# Patient Record
Sex: Female | Born: 1940 | Race: Black or African American | Hispanic: No | Marital: Single | Smoking: Current every day smoker
Health system: Southern US, Community
[De-identification: ages and names within clinical notes are randomized; demographics above are authoritative.]

## PROBLEM LIST (undated history)

## (undated) DIAGNOSIS — I340 Nonrheumatic mitral (valve) insufficiency: Secondary | ICD-10-CM

## (undated) DIAGNOSIS — I639 Cerebral infarction, unspecified: Secondary | ICD-10-CM

## (undated) DIAGNOSIS — I10 Essential (primary) hypertension: Secondary | ICD-10-CM

## (undated) DIAGNOSIS — Y249XXA Unspecified firearm discharge, undetermined intent, initial encounter: Secondary | ICD-10-CM

## (undated) DIAGNOSIS — W3400XA Accidental discharge from unspecified firearms or gun, initial encounter: Secondary | ICD-10-CM

## (undated) DIAGNOSIS — I509 Heart failure, unspecified: Secondary | ICD-10-CM

## (undated) DIAGNOSIS — E785 Hyperlipidemia, unspecified: Secondary | ICD-10-CM

## (undated) DIAGNOSIS — I729 Aneurysm of unspecified site: Secondary | ICD-10-CM

## (undated) HISTORY — PX: KNEE SURGERY: SHX244

## (undated) HISTORY — PX: ANKLE SURGERY: SHX546

## (undated) HISTORY — PX: FUSION OF TALONAVICULAR JOINT: SHX6332

## (undated) HISTORY — PX: ABDOMINAL HYSTERECTOMY: SHX81

---

## 2004-01-12 ENCOUNTER — Encounter: Admission: RE | Admit: 2004-01-12 | Discharge: 2004-01-12 | Payer: Self-pay | Admitting: Sports Medicine

## 2004-02-08 ENCOUNTER — Encounter: Admission: RE | Admit: 2004-02-08 | Discharge: 2004-02-08 | Payer: Self-pay | Admitting: Family Medicine

## 2004-02-08 ENCOUNTER — Encounter: Admission: RE | Admit: 2004-02-08 | Discharge: 2004-02-08 | Payer: Self-pay | Admitting: Sports Medicine

## 2004-03-08 ENCOUNTER — Encounter: Admission: RE | Admit: 2004-03-08 | Discharge: 2004-03-08 | Payer: Self-pay | Admitting: Family Medicine

## 2004-06-01 ENCOUNTER — Encounter: Admission: RE | Admit: 2004-06-01 | Discharge: 2004-06-01 | Payer: Self-pay | Admitting: Cardiology

## 2004-07-13 IMAGING — CR DG CHEST 2V
2 series · 2 of 2 positions shown · non-contrast
Comparison: none

CLINICAL DATA: Patient has shortness and breath and history of smoking. 
 CHEST, TWO VIEWS: 
 PA and lateral views reveal the heart size to be normal with mild dilatation of the aorta.  There are areas of calcification noted in the hilar regions.  No active lung findings.

[view not recorded (1 of 2)]
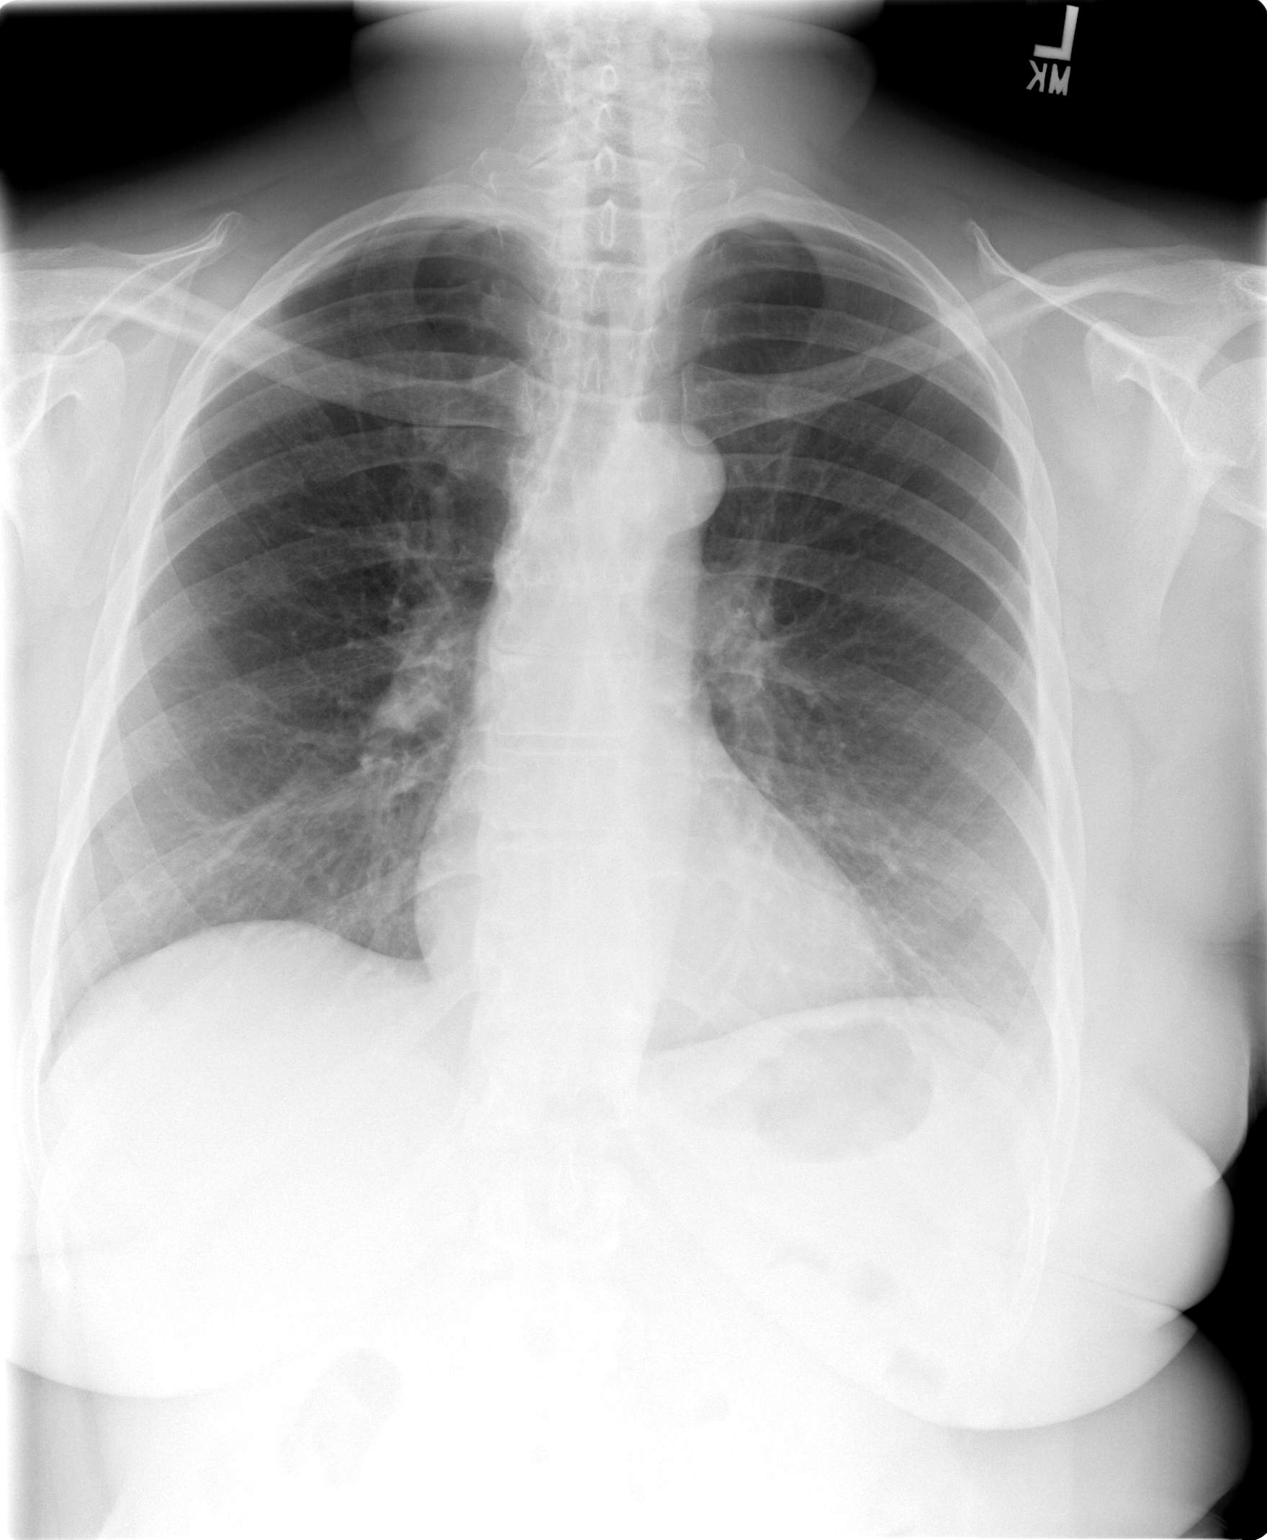

[view not recorded (2 of 2)]
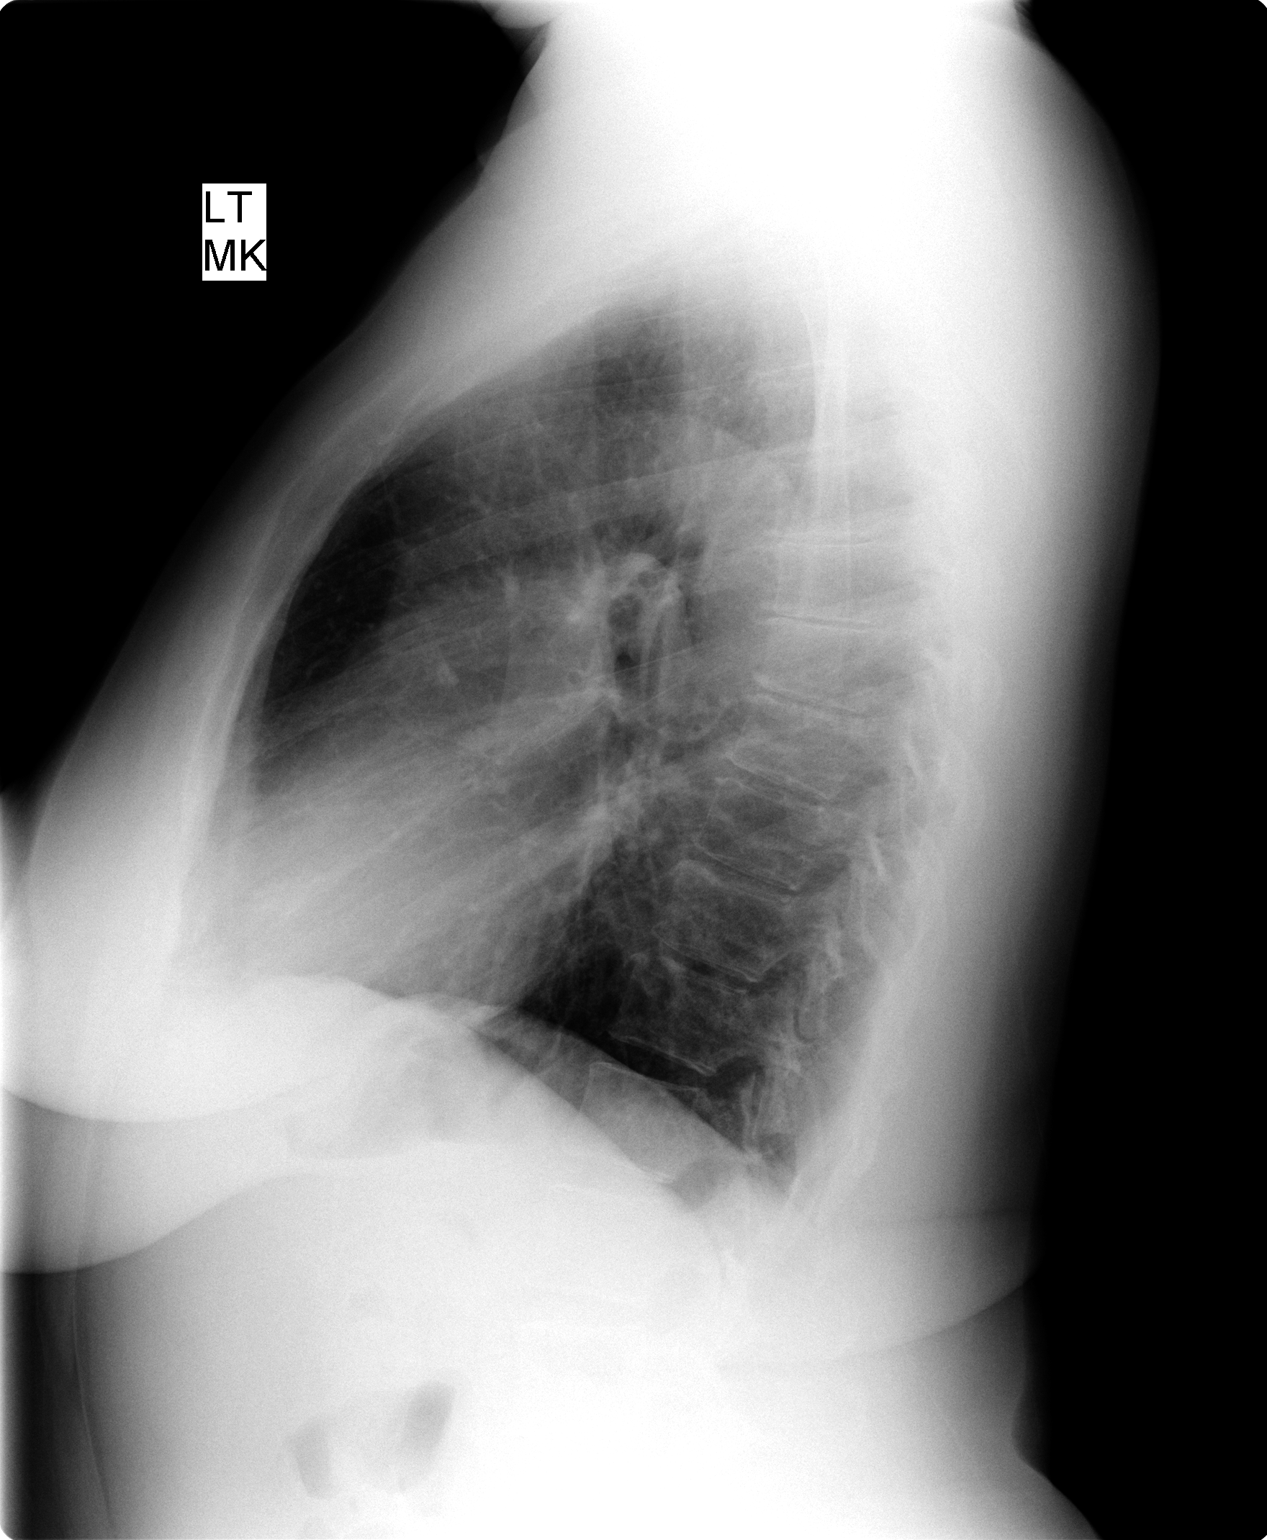

[2 of 2 positions shown; findings below may reference images not displayed]

IMPRESSION: No active disease.

## 2004-07-24 ENCOUNTER — Ambulatory Visit: Payer: Self-pay | Admitting: Family Medicine

## 2007-01-09 DIAGNOSIS — I10 Essential (primary) hypertension: Secondary | ICD-10-CM | POA: Insufficient documentation

## 2007-01-09 DIAGNOSIS — R079 Chest pain, unspecified: Secondary | ICD-10-CM | POA: Insufficient documentation

## 2007-01-09 DIAGNOSIS — F17200 Nicotine dependence, unspecified, uncomplicated: Secondary | ICD-10-CM | POA: Insufficient documentation

## 2017-04-24 ENCOUNTER — Emergency Department (HOSPITAL_COMMUNITY)
Admission: EM | Admit: 2017-04-24 | Discharge: 2017-04-24 | Disposition: A | Discharge status: Home / Self Care | Payer: Medicare Other | Attending: Emergency Medicine | Admitting: Emergency Medicine

## 2017-04-24 ENCOUNTER — Encounter (HOSPITAL_COMMUNITY): Payer: Self-pay | Admitting: Emergency Medicine

## 2017-04-24 ENCOUNTER — Emergency Department (HOSPITAL_COMMUNITY): Payer: Medicare Other

## 2017-04-24 DIAGNOSIS — Z7901 Long term (current) use of anticoagulants: Secondary | ICD-10-CM | POA: Diagnosis not present

## 2017-04-24 DIAGNOSIS — K769 Liver disease, unspecified: Secondary | ICD-10-CM

## 2017-04-24 DIAGNOSIS — K7689 Other specified diseases of liver: Secondary | ICD-10-CM | POA: Diagnosis not present

## 2017-04-24 DIAGNOSIS — I10 Essential (primary) hypertension: Secondary | ICD-10-CM | POA: Diagnosis not present

## 2017-04-24 DIAGNOSIS — Z79899 Other long term (current) drug therapy: Secondary | ICD-10-CM | POA: Insufficient documentation

## 2017-04-24 DIAGNOSIS — N390 Urinary tract infection, site not specified: Secondary | ICD-10-CM | POA: Diagnosis not present

## 2017-04-24 DIAGNOSIS — R319 Hematuria, unspecified: Secondary | ICD-10-CM | POA: Diagnosis present

## 2017-04-24 DIAGNOSIS — F17200 Nicotine dependence, unspecified, uncomplicated: Secondary | ICD-10-CM | POA: Diagnosis not present

## 2017-04-24 HISTORY — DX: Cerebral infarction, unspecified: I63.9

## 2017-04-24 HISTORY — DX: Accidental discharge from unspecified firearms or gun, initial encounter: W34.00XA

## 2017-04-24 HISTORY — DX: Nonrheumatic mitral (valve) insufficiency: I34.0

## 2017-04-24 HISTORY — DX: Unspecified firearm discharge, undetermined intent, initial encounter: Y24.9XXA

## 2017-04-24 HISTORY — DX: Essential (primary) hypertension: I10

## 2017-04-24 LAB — CBC
HCT: 37.8 % (ref 36.0–46.0)
Hemoglobin: 13.3 g/dL (ref 12.0–15.0)
MCH: 26.4 pg (ref 26.0–34.0)
MCHC: 35.2 g/dL (ref 30.0–36.0)
MCV: 75.1 fL — AB (ref 78.0–100.0)
PLATELETS: 220 10*3/uL (ref 150–400)
RBC: 5.03 MIL/uL (ref 3.87–5.11)
RDW: 15.2 % (ref 11.5–15.5)
WBC: 8.1 10*3/uL (ref 4.0–10.5)

## 2017-04-24 LAB — URINALYSIS, ROUTINE W REFLEX MICROSCOPIC: Squamous Epithelial / LPF: NONE SEEN

## 2017-04-24 LAB — BASIC METABOLIC PANEL
Anion gap: 5 (ref 5–15)
BUN: 18 mg/dL (ref 6–20)
CHLORIDE: 105 mmol/L (ref 101–111)
CO2: 30 mmol/L (ref 22–32)
Calcium: 8.7 mg/dL — ABNORMAL LOW (ref 8.9–10.3)
Creatinine, Ser: 1.15 mg/dL — ABNORMAL HIGH (ref 0.44–1.00)
GFR calc Af Amer: 53 mL/min — ABNORMAL LOW (ref >60)
GFR, EST NON AFRICAN AMERICAN: 45 mL/min — AB (ref >60)
GLUCOSE: 105 mg/dL — AB (ref 65–99)
POTASSIUM: 3.2 mmol/L — AB (ref 3.5–5.1)
Sodium: 140 mmol/L (ref 135–145)

## 2017-04-24 LAB — PROTIME-INR
INR: 1
Prothrombin Time: 13.2 seconds (ref 11.4–15.2)

## 2017-04-24 MED ORDER — CEPHALEXIN 500 MG PO CAPS: 500.0000 mg | ORAL_CAPSULE | Freq: Three times a day (TID) | ORAL | 0 refills | Status: AC

## 2017-04-24 MED ORDER — POTASSIUM CHLORIDE CRYS ER 20 MEQ PO TBCR
40.0000 meq | EXTENDED_RELEASE_TABLET | Freq: Once | ORAL | Status: AC
  Administered 2017-04-24: 40 meq via ORAL
  Filled 2017-04-24: qty 2

## 2017-04-24 MED ORDER — DEXTROSE 5 % IV SOLN
1.0000 g | Freq: Once | INTRAVENOUS | Status: AC
  Administered 2017-04-24: 1 g via INTRAVENOUS
  Filled 2017-04-24: qty 10

## 2017-04-24 MED ORDER — LACTATED RINGERS IV BOLUS (SEPSIS)
1000.0000 mL | Freq: Once | INTRAVENOUS | Status: AC
  Administered 2017-04-24: 1000 mL via INTRAVENOUS

## 2017-04-24 NOTE — ED Notes (Signed)
Patient requested and was given two Malawiturkey sandwiches and 240 of cranberry juice. .Marland Kitchen
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
## 2017-04-24 NOTE — ED Triage Notes (Signed)
Pt from home with reports of hematuria with "lots blood" that began today. Pt states blood was both rust colored and bright red. Pt denies pain, but endorses pressure

## 2017-04-27 LAB — URINE CULTURE
Culture: >=100000 — AB
Special Requests: NORMAL

## 2017-04-28 ENCOUNTER — Telehealth: Payer: Self-pay

## 2017-04-28 NOTE — Telephone Encounter (Signed)
Post ED Visit - Positive Culture Follow-up  Culture report reviewed by antimicrobial stewardship pharmacist:  []  Enzo BiNathan Batchelder, Pharm.D. []  Celedonio MiyamotoJeremy Frens, [address]Ph[address] BCPS AQ-ID [x]  Garvin FilaMike Maccia, Pharm.D., BCPS []  Georgina PillionElizabeth Martin, [address]Ph[address] BCPS []  FederalsburgMinh Pham, [place]arm.D., BCPS, AAHIVP []  Estella HuskMichelle Turner, Pharm.D., BCPS, AAHIVP []  Lysle Pearlachel Rumbarger, PharmD, BCPS []  Casilda Carlsaylor Stone, PharmD, BCPS []  Pollyann SamplesAndy Johnston, PharmD, BCPS  Positive urine culture Treated with Cephalexin, organism sensitive to the same and no further patient follow-up is required at this time.  Jerry CarasCullom, Shonette Rhames Burnett 04/28/2017, 11:30 AM

## 2017-09-27 IMAGING — CT CT RENAL STONE PROTOCOL
2 of 3 series · 17 of 46 positions shown, 19 images · non-contrast
Comparison: None.

CLINICAL DATA: 76-year-old female with abdominal pressure and
hematuria.

EXAM:
CT ABDOMEN AND PELVIS WITHOUT CONTRAST
TECHNIQUE: Multidetector CT imaging of the abdomen and pelvis was performed
following the standard protocol without IV contrast.

[Series 4: lung · axial · 0.80mm/px · z∈[-341,-241]mm · 14 of 58 slices shown, 16 images]
[im 4/58  soft-tissue]
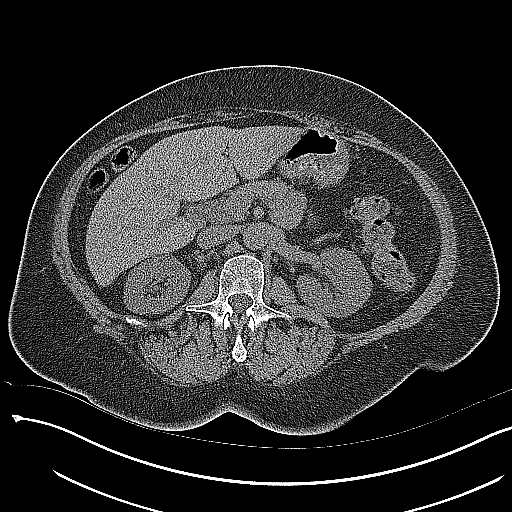
[im 4/58  bone]
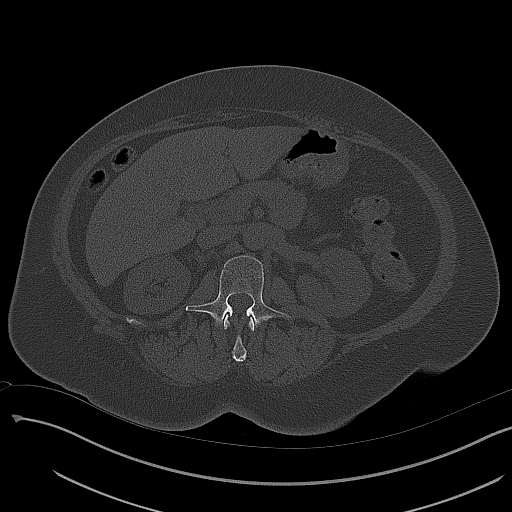
[im 8/58  soft-tissue]
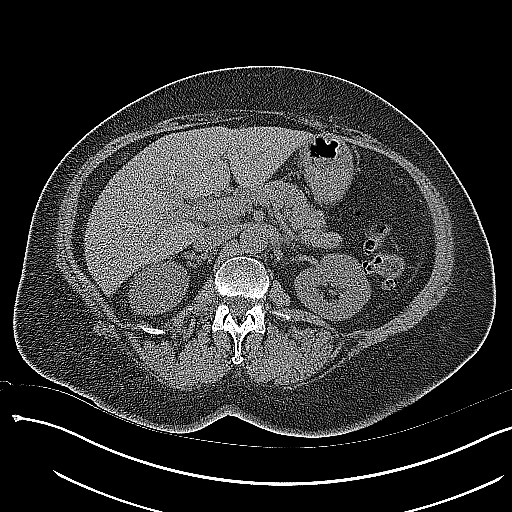
[im 12/58  soft-tissue]
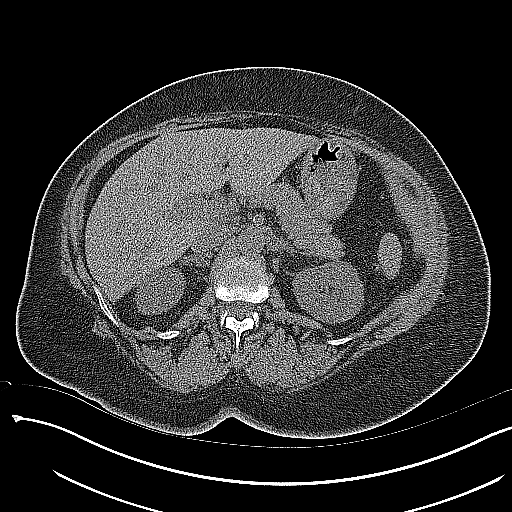
[im 15/58  soft-tissue]
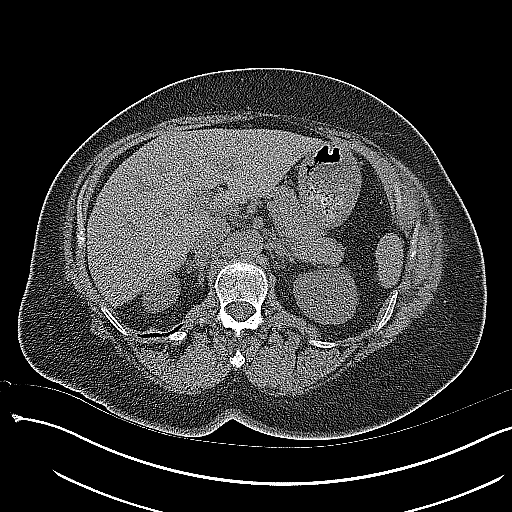
[im 19/58  soft-tissue]
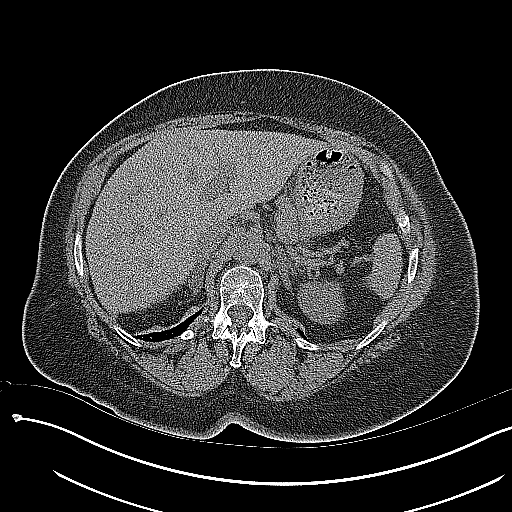
[im 23/58  soft-tissue]
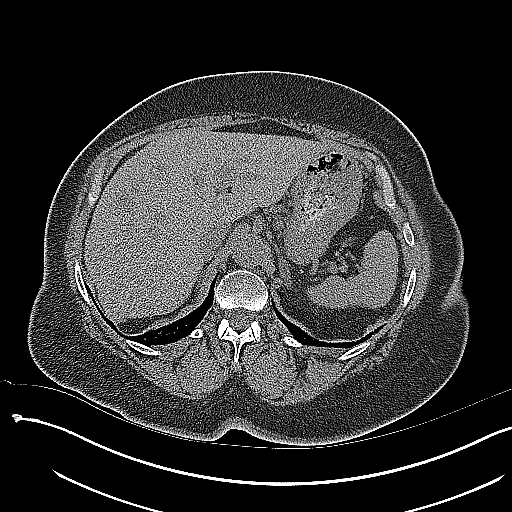
[im 26/58  soft-tissue]
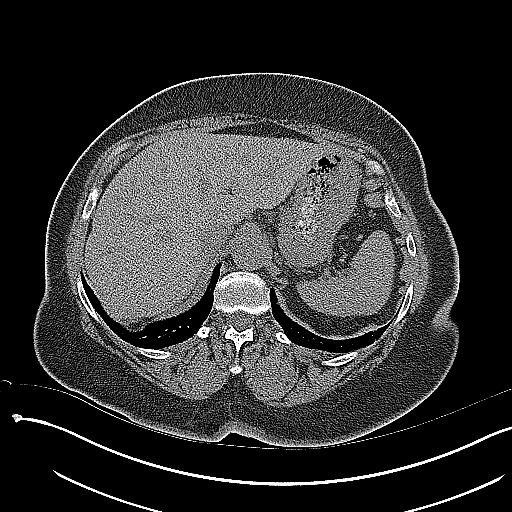
[im 32/58  soft-tissue]
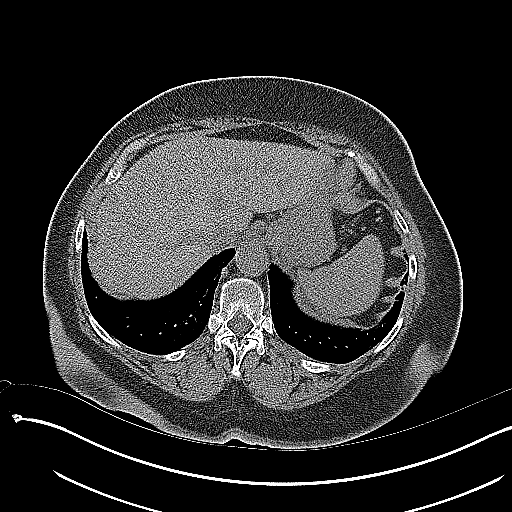
[im 35/58  soft-tissue]
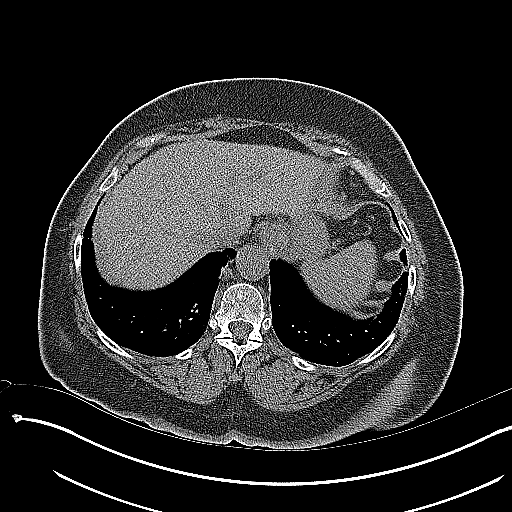
[im 35/58  bone]
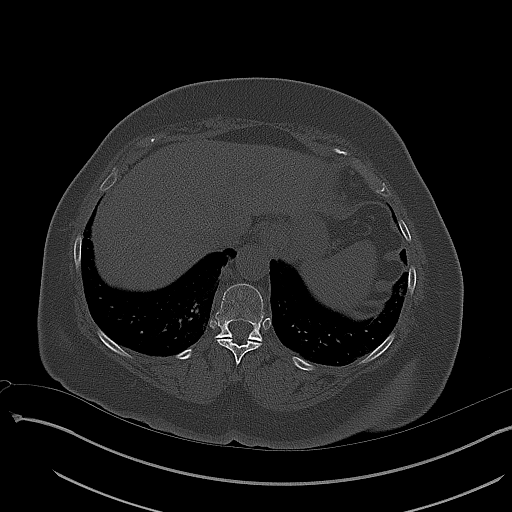
[im 39/58  soft-tissue]
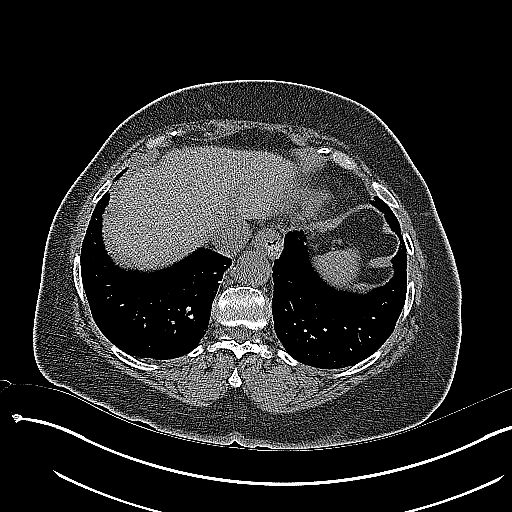
[im 43/58  soft-tissue]
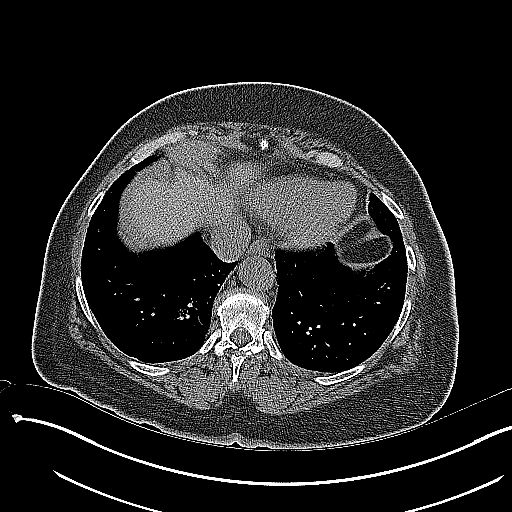
[im 46/58  soft-tissue]
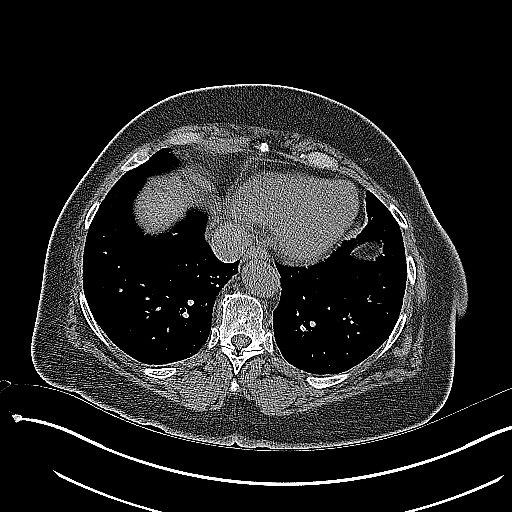
[im 50/58  soft-tissue]
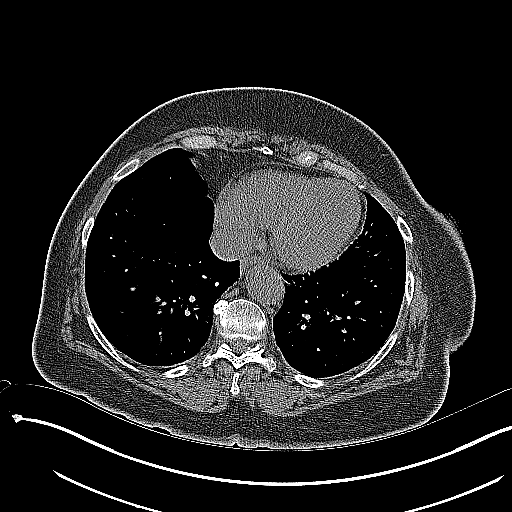
[im 54/58  soft-tissue]
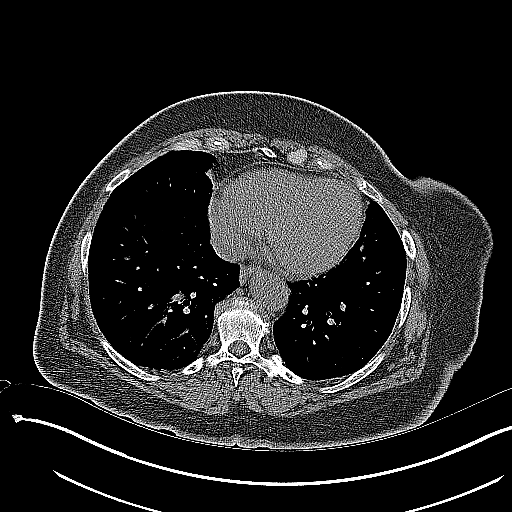

[Series 5: coronal · coronal · 0.80mm/px · 3 of 150 slices shown]
[im 50/150  soft-tissue]
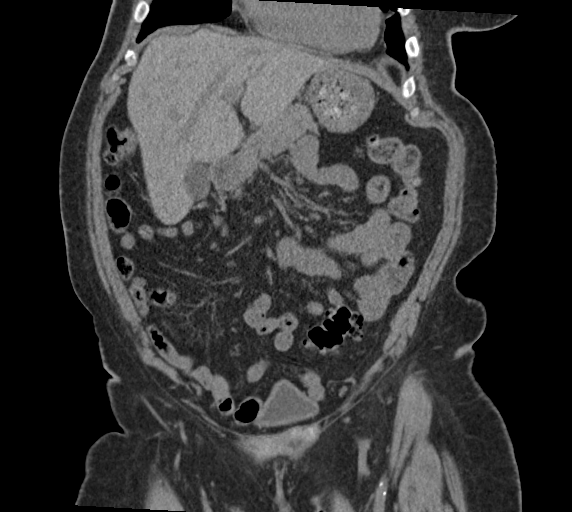
[im 67/150  soft-tissue]
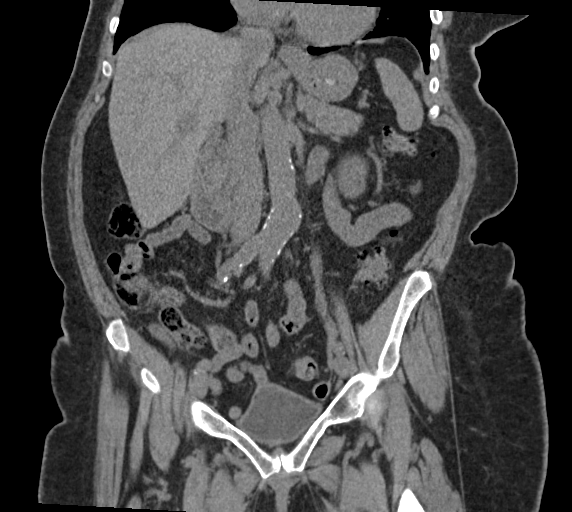
[im 83/150  soft-tissue]
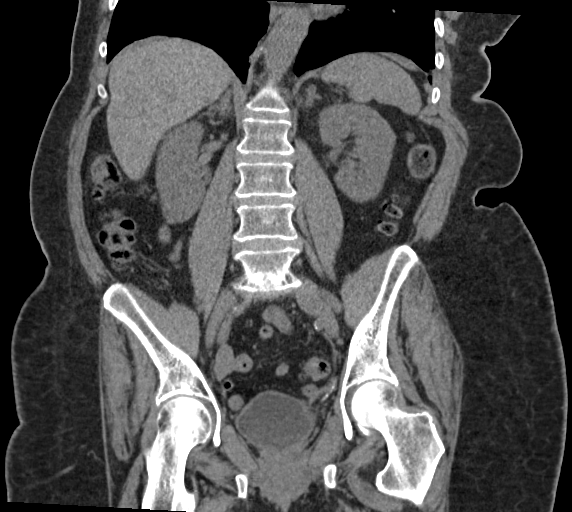

[17 of 46 positions shown; findings below may reference images not displayed]

FINDINGS: Please note that parenchymal abnormalities may be missed without
intravenous contrast.

Lower chest: No acute abnormality

Hepatobiliary: A 1.5 cm hypodense lesion within the right liver
(image 15) is noted. The remainder of the liver and gallbladder are
unremarkable. There is no evidence of biliary dilatation.

Pancreas: Unremarkable

Spleen: Unremarkable

Adrenals/Urinary Tract: The kidneys, adrenal glands and bladder are
unremarkable. There is no evidence of hydronephrosis or urinary
calculi.

Stomach/Bowel: Colonic diverticulosis noted without evidence of
diverticulitis. There is no evidence of bowel obstruction, bowel
wall thickening or inflammatory changes.

Vascular/Lymphatic: Aortic atherosclerosis. No enlarged abdominal or
pelvic lymph nodes.

Reproductive: Status post hysterectomy. No adnexal masses.

Other: No free fluid, focal collection or pneumoperitoneum.

Musculoskeletal: Moderate degenerative disc disease at L5-S1 noted.
IMPRESSION: No evidence of acute abnormality.

Indeterminate 1.5 cm right hepatic lesion. If no prior outside
imaging studies are available for comparison, elective hepatic MRI
with and without contrast recommended for further evaluation.

Aortic Atherosclerosis (OCBKJ-S84.4).

## 2021-01-13 ENCOUNTER — Other Ambulatory Visit: Payer: Self-pay | Admitting: Internal Medicine

## 2021-01-13 DIAGNOSIS — N6321 Unspecified lump in the left breast, upper outer quadrant: Secondary | ICD-10-CM

## 2022-06-08 ENCOUNTER — Emergency Department: Payer: No Typology Code available for payment source

## 2022-06-08 ENCOUNTER — Emergency Department
Admission: EM | Admit: 2022-06-08 | Discharge: 2022-06-08 | Disposition: A | Discharge status: Home / Self Care | Payer: No Typology Code available for payment source | Attending: Emergency Medicine | Admitting: Emergency Medicine

## 2022-06-08 DIAGNOSIS — S82832D Other fracture of upper and lower end of left fibula, subsequent encounter for closed fracture with routine healing: Secondary | ICD-10-CM | POA: Insufficient documentation

## 2022-06-08 DIAGNOSIS — X58XXXD Exposure to other specified factors, subsequent encounter: Secondary | ICD-10-CM | POA: Insufficient documentation

## 2022-06-08 DIAGNOSIS — Y9389 Activity, other specified: Secondary | ICD-10-CM | POA: Insufficient documentation

## 2022-06-08 HISTORY — DX: Heart failure, unspecified: I50.9

## 2022-06-08 HISTORY — DX: Cerebral infarction, unspecified: I63.9

## 2022-06-08 HISTORY — DX: Aneurysm of unspecified site: I72.9

## 2022-06-08 HISTORY — DX: Essential (primary) hypertension: I10

## 2022-06-08 HISTORY — DX: Hyperlipidemia, unspecified: E78.5

## 2022-06-08 MED ORDER — TRAMADOL HCL 50 MG PO TABS
50.0000 mg | ORAL_TABLET | Freq: Four times a day (QID) | ORAL | Status: DC | PRN
Start: 2022-06-08 — End: 2022-06-08
  Administered 2022-06-08: 50 mg via ORAL
  Filled 2022-06-08: qty 1

## 2022-06-08 NOTE — Discharge Instructions (Signed)
Dear Ms. Swetz:    Thank you for choosing the [facility] Emergency Department, the premier emergency department in the [place] area.  I hope your visit today was EXCELLENT. You will receive a survey via text message that will give you the opportunity to provide feedback to your team about your visit. Please do not hesitate to reach out with any questions!    Specific instructions for your visit today:    Our orthopedist have reviewed your x-rays.  There is nothing to do at this time for this left ankle fracture.    You may follow-up with our orthopedist for further evaluation in their office.    You may continue to wear the boot as previously.    Return to ER for any worsening symptoms.    IF YOU DO NOT CONTINUE TO IMPROVE OR YOUR CONDITION WORSENS, PLEASE CONTACT YOUR DOCTOR OR RETURN IMMEDIATELY TO THE EMERGENCY DEPARTMENT.    Sincerely,  Elda Dunkerson, Loretha Stapler, MD  Attending Emergency Physician  [facility] Emergency Department      OBTAINING A PRIMARY CARE APPOINTMENT    Primary care physicians (PCPs, also known as primary care doctors) are either internists or family medicine doctors. Both types of PCPs focus on health promotion, disease prevention, patient education and counseling, and treatment of acute and chronic medical conditions.    If you need a primary care doctor, please call the below number and ask who is receiving new patients.     [facility]  Telephone:  [phone]  [email or web address]    DOCTOR REFERRALS  Call [phone] (available 24 hours a day, 7 days a week) if you need any further referrals and we can help you find a primary care doctor or specialist.  Also, available online at:  [email or web address]    YOUR CONTACT INFORMATION  Before leaving please check with registration to make sure we have an up-to-date contact number.  You can call registration at [phone] to update your information.  For questions about your hospital bill, please call  [phone].  For questions about your Emergency Dept Physician bill please call [phone].      FREE HEALTH SERVICES  If you need help with health or social services, please call 2-1-1 for a free referral to resources in your area.  2-1-1 is a free service connecting people with information on health insurance, free clinics, pregnancy, mental health, dental care, food assistance, housing, and substance abuse counseling.  Also, available online at:  http://www.[place].org    ORTHOPEDIC INJURY   Please know that significant injuries can exist even when an initial x-ray is read as normal or negative.  This can occur because some fractures (broken bones) are not initially visible on x-rays.  For this reason, close outpatient follow-up with your primary care doctor or bone specialist (orthopedist) is required.    MEDICATIONS AND FOLLOWUP  Please be aware that some prescription medications can cause drowsiness.  Use caution when driving or operating machinery.    The examination and treatment you have received in our Emergency Department is provided on an emergency basis, and is not intended to be a substitute for your primary care physician.  It is important that your doctor checks you again and that you report any new or remaining problems at that time.      ASSISTANCE WITH INSURANCE    Affordable Care Act  [facility])  Call to start or finish an application, compare plans, enroll or ask a question.  [phone]  TTYZA:4145287  Web:  Healthcare.gov    Help Enrolling in [place]  (571)883-6028 (TOLL-FREE)  820-259-1108 (TTY)  Web:  Http://www.coverva.org    Local Help Enrolling in the [place]  (640) 714-3478 (MAIN)  Email:  health-help'@nvfs'$ .org  Web:  http://lewis-perez.info/  Address:  [id number] [place], [place] [address]    SEDATING MEDICATIONS  Sedating medications include strong pain medications (e.g. narcotics), muscle relaxers,  benzodiazepines (used for anxiety and as muscle relaxers), Benadryl/diphenhydramine and other antihistamines for allergic reactions/itching, and other medications.  If you are unsure if you have received a sedating medication, please ask your physician or nurse.  If you received a sedating medication: DO NOT drive a car. DO NOT operate machinery. DO NOT perform jobs where you need to be alert.  DO NOT drink alcoholic beverages while taking this medicine.     If you get dizzy, sit or lie down at the first signs. Be careful going up and down stairs.  Be extra careful to prevent falls.     Never give this medicine to others.     Keep this medicine out of reach of children.     Do not take or save old medicines. Throw them away when outdated.     Keep all medicines in a cool, dry place. DO NOT keep them in your bathroom medicine cabinet or in a cabinet above the stove.    MEDICATION REFILLS  Please be aware that we cannot refill any prescriptions through the ER. If you need further treatment from what is provided at your ER visit, please follow up with your primary care doctor or your pain management specialist.    [facility]  Did you know [facility] has two freestanding ERs located just a few miles away?  [facility] [place] have short wait times, easy free parking directly in front of the building and top patient satisfaction scores - and the same Board Certified Emergency Medicine doctors as [facility].
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
## 2022-06-08 NOTE — ED Provider Notes (Signed)
[facility] EMERGENCY DEPARTMENT RESIDENT H&P       CLINICAL INFORMATION        HPI:      Chief Complaint: Ankle Pain  .    Darlene Lamb is a 81 y.o. female who presents with L ankle pain related to an ankle fracture sustained while getting in a tax on 20 April 2022. She was seen in the ED then and placed in a boot. She had not received follow up care until yesterday, when she saw an orthopedic surgeon, Dr. Tedd Sias in [place], [place]. She states that Dr. Brown Human told her to go to the ED but she cannot state why.     History obtained from: patient            ROS:      Review of Systems   Musculoskeletal:  Positive for arthralgias, gait problem and joint swelling.        Pt reports pain in L ankle, knee, and hip   Neurological:  Positive for numbness.        Reports new numbness in the left foot   All other systems reviewed and are negative.        Physical Exam:      Pulse 82  BP 123/76  Resp 20  SpO2 94 %  Temp 97.1 F (36.2 C)    Physical Exam  Vitals and nursing note reviewed.   Constitutional:       Appearance: Normal appearance. She is obese.   HENT:      Head: Normocephalic and atraumatic.   Cardiovascular:      Rate and Rhythm: Normal rate and regular rhythm.   Pulmonary:      Effort: Pulmonary effort is normal.   Abdominal:      General: Abdomen is flat.   Musculoskeletal:         General: Swelling and tenderness present.      Left lower leg: Edema present.      Comments: LLE skin intact, pedal pulses and sensation intact. Mild edema below the knee. Can wiggle toes slightly but cannot flex or extend the L ankle.    Skin:     General: Skin is warm.   Neurological:      General: No focal deficit present.      Mental Status: She is alert.   Psychiatric:         Mood and Affect: Mood normal.         Behavior: Behavior normal.         Thought Content: Thought content normal.         Judgment: Judgment normal.                 PAST HISTORY        Primary Care Provider: No primary care  provider on file.        PMH/PSH:    .     Past Medical History:   Diagnosis Date    Aneurysm     Heart failure     Hyperlipidemia     Hypertension     Stroke        She has a past surgical history that includes Knee surgery and Ankle surgery (Left).      Social/Family History:      She reports that she has quit smoking. Her smoking use included cigarettes. She has never used smokeless tobacco. She reports that she does not drink alcohol and does not  use drugs.    History reviewed. No pertinent family history.      Listed Medications on Arrival:    .     Home Medications       Med List Status: In Progress Set By: Doylene Canard., RN at 06/08/2022  1:23 PM   No Medications        Allergies: She is allergic to aspirin, penicillins, percocet [oxycodone-acetaminophen], and tape.            VISIT INFORMATION        Reassessments/Clinical Course:    LLE neurvasularly intact although she reports some numbness in that foot. Ankle X-ray performed and found:  1.Subacute, mildly displaced, obliquely oriented fracture of the fibula  with mild healing changes.   2.Soft tissue swelling of the ankle, most pronounced laterally.    Call placed to Dr. Sharyne Richters office and message left with receptionist.     Consulted in-house ortho who reviewed her plain films. They are happy to follow up with her in clinic as an outpatient. No acute interventions necessary. Will discharge with instructions to follow up with ortho.         Conversations with Other Providers:              Medications Given in the ED:    .     ED Medication Orders (From admission, onward)      Start Ordered     Status Ordering Provider    06/08/22 1330 06/08/22 1332  traMADol (ULTRAM) tablet 50 mg  Every 6 hours PRN        Route: Oral  Ordered Dose: 50 mg       Last MAR action: Given Waylen Depaolo L              Procedures:      Procedures      Assessment/Plan:                 Lala Lund, MD  Resident  06/08/22 1728

## 2022-06-28 ENCOUNTER — Emergency Department: Payer: No Typology Code available for payment source

## 2022-06-28 ENCOUNTER — Emergency Department
Admission: EM | Admit: 2022-06-28 | Discharge: 2022-06-29 | Disposition: A | Discharge status: Home / Self Care | Payer: No Typology Code available for payment source | Attending: Emergency Medicine | Admitting: Emergency Medicine

## 2022-06-28 ENCOUNTER — Encounter (INDEPENDENT_AMBULATORY_CARE_PROVIDER_SITE_OTHER): Payer: No Typology Code available for payment source | Admitting: Orthopaedic Surgery

## 2022-06-28 DIAGNOSIS — S82445D Nondisplaced spiral fracture of shaft of left fibula, subsequent encounter for closed fracture with routine healing: Secondary | ICD-10-CM | POA: Insufficient documentation

## 2022-06-28 DIAGNOSIS — S82832A Other fracture of upper and lower end of left fibula, initial encounter for closed fracture: Secondary | ICD-10-CM | POA: Insufficient documentation

## 2022-06-28 DIAGNOSIS — M25572 Pain in left ankle and joints of left foot: Secondary | ICD-10-CM | POA: Insufficient documentation

## 2022-06-28 DIAGNOSIS — W1830XD Fall on same level, unspecified, subsequent encounter: Secondary | ICD-10-CM | POA: Insufficient documentation

## 2022-06-28 MED ORDER — GABAPENTIN 100 MG PO CAPS
100.0000 mg | ORAL_CAPSULE | Freq: Once | ORAL | Status: AC
Start: 2022-06-28 — End: 2022-06-28
  Administered 2022-06-28: 100 mg via ORAL
  Filled 2022-06-28: qty 1

## 2022-06-28 MED ORDER — LIDOCAINE 5 % EX PTCH
1.0000 | MEDICATED_PATCH | CUTANEOUS | Status: DC
  Administered 2022-06-28: 1 via TRANSDERMAL
  Filled 2022-06-28: qty 1

## 2022-06-28 NOTE — ED Notes (Signed)
Bed: S 14  Expected date:   Expected time:   Means of arrival:   Comments:  RN has D

## 2022-06-28 NOTE — Discharge Instructions (Addendum)
Dear Darlene Lamb:    Thank you for choosing the [facility] Emergency Department, the premier emergency department in the [place] area.  I hope your visit today was EXCELLENT.    Specific instructions for your visit today:    You have an appointment with Dr Lucendia Herrlich on 24th August.  Most of the time, your type of fracture does not need surgery. Your x-rays today show it is healing as expected.  When we say "fractured bone" it means the same as "broken bone". They are used interchangeably and mean the same thing.    You can weight bear as tolerated.  Take acetaminophen 1000mg  up to FOUR times a day for the pain. You can also take ibuprofen 600mg  up to THREE times a day.  See your primary care doctor for other pain medicine like gabapentin or lidocaine cream/patches if they help.    If you do not continue to improve or your condition worsens, please contact your doctor or return immediately to the Emergency Department.    Sincerely,  Arville Care, Evern Bio, MD  Attending Emergency Physician  [facility] Emergency Department    ONSITE PHARMACY  Our full service onsite pharmacy is located in the ER waiting room.  Open 7 days a week from 9 am to 9 pm.  We accept all major insurances and prices are competitive with major retailers.  Ask your provider to print your prescriptions down to the pharmacy to speed you on your way home.    OBTAINING A PRIMARY CARE APPOINTMENT    Primary care physicians (PCPs, also known as primary care doctors) are either internists or family medicine doctors. Both types of PCPs focus on health promotion, disease prevention, patient education and counseling, and treatment of acute and chronic medical conditions.    Call for an appointment with a primary care doctor.  Ask to see who is taking new patients.     [facility]  telephone:  [phone]  [email or web address]    DOCTOR REFERRALS  Call [phone] (available 24 hours a day, 7 days a week) if you need any further referrals  and we can help you find a primary care doctor or specialist.  Also, available online at:  [email or web address]    YOUR CONTACT INFORMATION  Before leaving please check with registration to make sure we have an up-to-date contact number.  You can call registration at [phone] to update your information.  For questions about your hospital bill, please call [phone].  For questions about your Emergency Dept Physician bill please call [phone].      FREE HEALTH SERVICES  If you need help with health or social services, please call 2-1-1 for a free referral to resources in your area.  2-1-1 is a free service connecting people with information on health insurance, free clinics, pregnancy, mental health, dental care, food assistance, housing, and substance abuse counseling.  Also, available online at:  http://www.[facility].org    MEDICAL RECORDS AND TESTS  Certain laboratory test results do not come back the same day, for example urine cultures.   We will contact you if other important findings are noted.  Radiology films are often reviewed again to ensure accuracy.  If there is any discrepancy, we will notify you.      Please call [phone] to pick up a complimentary CD of any radiology studies performed.  If you or your doctor would like to request a copy of your medical records, please call [phone].  ORTHOPEDIC INJURY   Please know that significant injuries can exist even when an initial x-ray is read as normal or negative.  This can occur because some fractures (broken bones) are not initially visible on x-rays.  For this reason, close outpatient follow-up with your primary care doctor or bone specialist (orthopedist) is required.    MEDICATIONS AND FOLLOWUP  Please be aware that some prescription medications can cause drowsiness.  Use caution when driving or operating machinery.    The examination and treatment you have received in our Emergency Department is provided on  an emergency basis, and is not intended to be a substitute for your primary care physician.  It is important that your doctor checks you again and that you report any new or remaining problems at that time.      24 HOUR PHARMACIES  The nearest 24 hour pharmacy is:    [facility]  [address]  [place], [place] [address]  [phone]      ASSISTANCE WITH INSURANCE    Affordable Care Act  [facility]  Call to start or finish an application, compare plans, enroll or ask a question.  [phone]  TTY: [phone]  Web:  Healthcare.gov    Help Enrolling in [facility]  [employer] [place]  856-724-3569 (TOLL-FREE)  [phone] (TTY)  Web:  Http://www.coverva.org    Local Help Enrolling in the [facility]  [place] [facility]  8540019874 (MAIN)  Email:  health-help@nvfs .org  Web:  BlackjackMyths.is  Address:  [address] [place], [place] [address]    SEDATING MEDICATIONS  Sedating medications include strong pain medications (e.g. narcotics), muscle relaxers, benzodiazepines (used for anxiety and as muscle relaxers), Benadryl/diphenhydramine and other antihistamines for allergic reactions/itching, and other medications.  If you are unsure if you have received a sedating medication, please ask your physician or nurse.  If you received a sedating medication: DO NOT drive a car. DO NOT operate machinery. DO NOT perform jobs where you need to be alert.  DO NOT drink alcoholic beverages while taking this medicine.     If you get dizzy, sit or lie down at the first signs. Be careful going up and down stairs.  Be extra careful to prevent falls.     Never give this medicine to others.     Keep this medicine out of reach of children.     Do not take or save old medicines. Throw them away when outdated.     Keep all medicines in a cool, dry place. DO NOT keep them in your bathroom medicine cabinet or in a cabinet above the stove.    MEDICATION REFILLS  Please be aware that we cannot refill  any prescriptions through the ER. If you need further treatment from what is provided at your ER visit, please follow up with your primary care doctor or your pain management specialist.    FREESTANDING EMERGENCY DEPARTMENTS OF [facility]  Did you know [facility] has two freestanding ERs located just a few miles away?  [facility] and [facility] [place] have short wait times, easy free parking directly in front of the building and top patient satisfaction scores - and the same Board Certified Emergency Medicine doctors as [facility].

## 2022-06-28 NOTE — ED Notes (Signed)
[facility] called to inform them that patient is being discharged. This RN was unable to reach anyone and left a message.

## 2022-06-28 NOTE — ED Provider Notes (Signed)
[facility] EMERGENCY DEPARTMENT  ATTENDING PHYSICIAN HISTORY AND PHYSICAL EXAM     Patient Name: Darlene Lamb, Darlene Lamb  Department:[facility] EMERGENCY DEPT  Encounter Date:  06/28/2022  Attending Physician: Earlean Shawl, MD   Age: 81 y.o. female  Patient Room: S D/S D  PCP: Pcp, None, MD           Diagnosis/Disposition:     Final diagnoses:   Closed nondisplaced spiral fracture of shaft of left fibula with routine healing, subsequent encounter       ED Disposition       ED Disposition   Discharge    Condition   --    Date/Time   Thu Jun 28, 2022  5:10 PM    Comment   Eual Fines discharge to home/self care.    Condition at disposition: Stable                 Follow-Up Providers (if applicable)    Joselyn Arrow, MD  [address]  Dept of Anesthesiology  [place] [address]  [phone]             New Prescriptions    No medications on file           Medical Decision Making:     Initial Differential Diagnosis:  Initial differential diagnosis to include but not limited to:  healing fracture, new fracture, missed fracture    Plan:  EMR shows cancelled appointment from today with ortho and rebooked for 24th  Seems she mainly has uncontrolled pain  Discussed her type of break (aka fracture) is usually ok with out operative fixation  Try lidocaine patch and gabapentin - see PCP or pain management if pain is ongoing.    Final Impression:  Healing distal fibula fracture with good alignment complicated by uncontrolled pain  The patient was deemed stable for discharge. They were given strict return precautions as it relates to their presumed diagnosis, verbalized understanding of these precautions and agreed to follow up as instructed. All questions were answered prior to discharge.         Medical Decision Making  Amount and/or Complexity of Data Reviewed  Radiology: ordered.    Risk  Prescription drug management.            History of Presenting Illness:     Nursing Triage note: Pt BIBA from rehab. Pt had L foot/ankle fracture  in July after falling trying to get into car. Pt states original plan was not to operate but pt belives the doctors want to re-eval. Pt reports increase in pain today and wanting a new xray. Pain 9/10. Pt unable to bear weight. Left foot in walking boot. Sensation intact and pt has limited movement due to pain. Pt is AOX4.  Chief complaint: Foot Injury and Foot Pain    Darlene Lamb is a 81 y.o. female here with pain in her left ankle. States she was sent by dr Brown Human to see Dr Lucendia Herrlich for operation. She has ongoing 8/10 pain in the ankle. States she can not weight bear due to the pain.  Seen here on July 28th with the fracture - appears different to the avulsion fracture described on June but has callus          Review of Systems:  Physical Exam:       Positive and negative ROS elements as per HPI.  The balance of a ten point review of systems is otherwise negative.  Pulse 70  BP 135/68  Resp 19  SpO2 97 %  Temp 97.7 F (36.5 C)     Physical Exam  Constitutional:       General: She is not in acute distress.     Appearance: She is obese. She is not ill-appearing, toxic-appearing or diaphoretic.   HENT:      Head: Normocephalic.      Nose: Nose normal.   Eyes:      General: No scleral icterus.     Conjunctiva/sclera: Conjunctivae normal.   Neck:      Comments: No goitre noted  Pulmonary:      Effort: Pulmonary effort is normal. No respiratory distress.      Breath sounds: No stridor.   Musculoskeletal:         General: No deformity or signs of injury.        Feet:    Neurological:      General: No focal deficit present.      Mental Status: She is alert and oriented to person, place, and time. Mental status is at baseline.   Psychiatric:         Mood and Affect: Mood normal.         Behavior: Behavior normal.                 Interpretations, Clinical Decision Tools and Critical Care:     O2 Sat:  The patient's oxygen saturation was 97 % on room air. This was independently interpreted by me as Normal.    Radiology:  I reviewed and independently interpreted the following radiology studies: prev noted oblique fibula fracture at distal end w/o joint involvement and with new callus formation consistent with healing fracture, Ankle mortise is intact, no signs of missed fracture when compared to prior film           Procedures:   Procedures      Attestations:     Scribe Attestation: There was no scribe involved in the care of this patient.     Documentation Notes:  Parts of this note were generated by the Epic EMR system/ Dragon speech recognition and may contain inherent errors or omissions not intended by the user. Grammatical errors, random word insertions, deletions, pronoun errors and incomplete sentences are occasional consequences of this technology due to software limitations. Not all errors are caught or corrected.  My documentation is often completed after the patient is no longer under my clinical care. In some cases, the Epic EMR may pull updated results into the above documentation which may not reflect all results or information that were available to me at the time of my medical decision making.   If there are questions or concerns about the content of this note or information contained within the body of this dictation they should be addressed directly with the author for clarification.                  Earlean Shawl, MD  06/29/22 1536

## 2022-06-28 NOTE — Progress Notes (Signed)
SW assistance requested by medical team regarding plans for ED visit. Attending MD Arville Care reported that patient informed triage RN and MD various reasons she has come to ED and that she was referred to the ED by her doctor.     SW met with patient at bedside. Patient reported that she was referred to ED by her doctor and provided referral to triage RN. Patient stated that she wants to have a new xray done today on her ankle to determine if her ankle is broken. Patient reported that other doctors are fighting to determine if she her ankle is "fractured" or "broken".     SW collaborated and updated attending MD [employer].     Jannett Celestine, LCSW   ED Social Worker Case Management   [employer]   [phone]

## 2022-06-28 NOTE — ED Notes (Signed)
Bed: S D  Expected date:   Expected time:   Means of arrival: [id number] [address]  Comments:

## 2022-06-28 NOTE — ED Triage Notes (Signed)
See quick triage

## 2022-07-05 ENCOUNTER — Ambulatory Visit (INDEPENDENT_AMBULATORY_CARE_PROVIDER_SITE_OTHER): Payer: No Typology Code available for payment source | Admitting: Orthopaedic Surgery

## 2022-07-05 ENCOUNTER — Encounter (INDEPENDENT_AMBULATORY_CARE_PROVIDER_SITE_OTHER): Payer: Self-pay | Admitting: Orthopaedic Surgery

## 2022-07-05 DIAGNOSIS — S8262XD Displaced fracture of lateral malleolus of left fibula, subsequent encounter for closed fracture with routine healing: Secondary | ICD-10-CM

## 2022-07-05 NOTE — Progress Notes (Signed)
Orthopaedic Trauma Patient Visit    Chief Complaint:   Chief Complaint   Patient presents with    Ankle Injury     11 weeks s/p left ankle fracture, DOI: 69/2023     Presenting problem: Acute Complicated injury (requiring appropriate multi-system eval)    HPI:  Darlene Lamb is a 81 y.o. female. Presents with L ankle pain related to an ankle fracture sustained while getting in a taxi on 20 April 2022. She was seen in the ED then and placed in a boot. She saw an orthopedic surgeon, Dr. Tedd Sias in [place], [place]. She states that Dr. Brown Human told her to go to the ED but doesn't remember why.  Since then also presented to the emergency department on 28 June 2022 despite having a follow-up in clinic next week.  Radiographs at that time were obtained and she was discharged from the emergency department with continued plan to follow-up outpatient.    Physical Exam:   Vitals:    07/05/22 1256   BP: 102/71   Pulse: 77       left lower extremity:  Skin intact without evidence of ecchymosis or erythema  +TA,GS  +EHL,FHL  Sensation intact light touch dorsal/plantar foot  Cap refill < 2 seconds  Pedal pulses palpable  No pain with passive ROM digits    Radiology:     Xrays taken in clinic today (Body part/Views/Reading): none  Outside Images interpreted and report reviewed : X-rays previously taken on 20 July and 17 August and independently reviewed in clinic today demonstrate the acute spiral fracture about the distal fibula at the level of the syndesmosis.  Most recent x-rays demonstrate interval callus formation about the fibula fracture with maintained alignment.  Both series of radiographs demonstrate congruent ankle mortise    Impression:   81 year old female with a left Weber B fibula fracture that occurred 11 weeks ago.  Interval appropriate healing within appropriate alignment for continued nonoperative management.    Plan:   Ongoing fx/dislocation mgment w/ appropriate consideration of immobilization, advance of  WB, ROM, and function; relative to significant risk of refracture     At this point can weight-bear as tolerated and wean from the cam boot immediately  Should continue working on ankle range of motion at rehab  Follow up PRN    Xrays at the next visit: L ankle following up with problems    -------------------------------------------------------------------  Risk of Problem/Management: Moderate     Darlene Lamb was seen today for ankle injury.    Diagnoses and all orders for this visit:    Closed displaced fracture of lateral malleolus of left fibula with routine healing        ----------------------------------------------------------------------------------------------------------  The review of the patient's medications does not in any way constitute an endorsement, by this clinician,  of their use, dosage, indications, route, efficacy, interactions, or other clinical parameters.    This note was generated within the EPIC EMR using Dragon medical speech recognition software and may contain inherent errors or omissions not intended by the user. Grammatical and punctuation errors, random word insertions, deletions, pronoun errors and incomplete sentences are occasional consequences of this technology due to software limitations. Not all errors are caught or corrected.  Although every attempt is made to root out erroneus and incomplete transcription, the note may still not fully represent the intent or opinion of the author. If there are questions or concerns about the content of this note or information contained within the body  of this dictation they should be addressed directly with the author for clarification.
